# Patient Record
Sex: Female | Born: 1955 | Race: Black or African American | Hispanic: No | Marital: Single | Smoking: Never smoker
Health system: Southern US, Community
[De-identification: ages and names within clinical notes are randomized; demographics above are authoritative.]

## PROBLEM LIST (undated history)

## (undated) DIAGNOSIS — E785 Hyperlipidemia, unspecified: Secondary | ICD-10-CM

## (undated) DIAGNOSIS — F329 Major depressive disorder, single episode, unspecified: Secondary | ICD-10-CM

## (undated) DIAGNOSIS — R42 Dizziness and giddiness: Secondary | ICD-10-CM

## (undated) DIAGNOSIS — R609 Edema, unspecified: Secondary | ICD-10-CM

## (undated) DIAGNOSIS — I10 Essential (primary) hypertension: Secondary | ICD-10-CM

## (undated) DIAGNOSIS — J45909 Unspecified asthma, uncomplicated: Secondary | ICD-10-CM

## (undated) DIAGNOSIS — F32A Depression, unspecified: Secondary | ICD-10-CM

## (undated) DIAGNOSIS — M1990 Unspecified osteoarthritis, unspecified site: Secondary | ICD-10-CM

## (undated) HISTORY — DX: Major depressive disorder, single episode, unspecified: F32.9

## (undated) HISTORY — DX: Edema, unspecified: R60.9

## (undated) HISTORY — PX: HERNIA REPAIR: SHX51

## (undated) HISTORY — DX: Unspecified asthma, uncomplicated: J45.909

## (undated) HISTORY — DX: Depression, unspecified: F32.A

## (undated) HISTORY — DX: Unspecified osteoarthritis, unspecified site: M19.90

## (undated) HISTORY — DX: Hyperlipidemia, unspecified: E78.5

---

## 2011-11-08 ENCOUNTER — Emergency Department (HOSPITAL_COMMUNITY)
Admission: EM | Admit: 2011-11-08 | Discharge: 2011-11-08 | Disposition: A | Discharge status: Home / Self Care | Payer: Self-pay | Attending: Emergency Medicine | Admitting: Emergency Medicine

## 2011-11-08 ENCOUNTER — Other Ambulatory Visit: Payer: Self-pay

## 2011-11-08 ENCOUNTER — Emergency Department (HOSPITAL_COMMUNITY): Payer: Self-pay

## 2011-11-08 ENCOUNTER — Encounter (HOSPITAL_COMMUNITY): Payer: Self-pay

## 2011-11-08 DIAGNOSIS — R110 Nausea: Secondary | ICD-10-CM | POA: Insufficient documentation

## 2011-11-08 DIAGNOSIS — Z7982 Long term (current) use of aspirin: Secondary | ICD-10-CM | POA: Insufficient documentation

## 2011-11-08 DIAGNOSIS — I10 Essential (primary) hypertension: Secondary | ICD-10-CM | POA: Insufficient documentation

## 2011-11-08 DIAGNOSIS — R079 Chest pain, unspecified: Secondary | ICD-10-CM | POA: Insufficient documentation

## 2011-11-08 DIAGNOSIS — R51 Headache: Secondary | ICD-10-CM | POA: Insufficient documentation

## 2011-11-08 DIAGNOSIS — R0602 Shortness of breath: Secondary | ICD-10-CM | POA: Insufficient documentation

## 2011-11-08 HISTORY — DX: Essential (primary) hypertension: I10

## 2011-11-08 HISTORY — DX: Dizziness and giddiness: R42

## 2011-11-08 LAB — CBC
MCH: 28.5 pg (ref 26.0–34.0)
MCHC: 33 g/dL (ref 30.0–36.0)
MCV: 86.3 fL (ref 78.0–100.0)
Platelets: 228 10*3/uL (ref 150–400)
RBC: 4.53 MIL/uL (ref 3.87–5.11)
RDW: 15.3 % (ref 11.5–15.5)

## 2011-11-08 LAB — BASIC METABOLIC PANEL
CO2: 25 mEq/L (ref 19–32)
Calcium: 10.1 mg/dL (ref 8.4–10.5)
Creatinine, Ser: 0.6 mg/dL (ref 0.50–1.10)
GFR calc non Af Amer: >90 mL/min (ref >90)

## 2011-11-08 LAB — POCT I-STAT TROPONIN I: Troponin i, poc: 0 ng/mL (ref 0.00–0.08)

## 2011-11-08 MED ORDER — FAMOTIDINE 20 MG PO TABS: 20.0000 mg | ORAL_TABLET | Freq: Two times a day (BID) | ORAL | Status: DC

## 2011-11-08 NOTE — ED Provider Notes (Signed)
History     CSN: [id number]  Arrival date & time 11/08/11  1145   First MD Initiated Contact with Patient 11/08/11 1157      Chief Complaint  Patient presents with  . Chest Pain    (Consider location/radiation/quality/duration/timing/severity/associated sxs/prior treatment) HPI Comments: Patient reports sudden onset of" heaviness" to her left chest that began at approximately 4 AM this morning. She states the pain radiates into her left arm. She also reports some nausea and shortness of breath with exertion. She also states that her chest pain seems to be exacerbated by exertion or palpation. She states that she had similar symptoms last August and was seen at another facility and admitted for 3 days but she is unclear of her diagnosis.  She was seen earlier this morning and [facility] [place] [facility] and was advised to come to the ER for further evaluation.  She denies dizziness, numbness, abdominal pain, vomiting, or visual changes.  Patient is a 56 y.o. female presenting with chest pain. The history is provided by the patient. No language interpreter was used.  Chest Pain The chest pain began 6 - 12 hours ago. Chest pain occurs constantly. The chest pain is unchanged. The pain is associated with breathing and exertion. The severity of the pain is moderate. Quality: "heaviness" The pain radiates to the left arm. Chest pain is worsened by exertion. Primary symptoms include shortness of breath and nausea. Pertinent negatives for primary symptoms include no fever, no fatigue, no syncope, no cough, no wheezing, no palpitations, no abdominal pain, no vomiting, no dizziness and no altered mental status.  The shortness of breath began today. The shortness of breath developed with exertion. The shortness of breath is mild.  Pertinent negatives for associated symptoms include no claudication, no diaphoresis, no lower extremity edema, no near-syncope, no numbness, no paroxysmal nocturnal dyspnea and  no weakness. She tried aspirin for the symptoms. Risk factors include alcohol intake.  Her past medical history is significant for hyperlipidemia and hypertension.  Pertinent negatives for past medical history include no CHF, no diabetes, no MI and no TIA. Past medical history comments: patient reports hx of chest pain last year but does not recall the diagnosis Procedure history comments: patient unsure of any previous cardiac catheterization.     Past Medical History  Diagnosis Date  . Hypertension   . Arthritis   . Vertigo   . Hypercholesteremia   . Depression (emotion)   . Migraine     Past Surgical History  Procedure Date  . Hernia repair     No family history on file.  History  Substance Use Topics  . Smoking status: Never Smoker   . Smokeless tobacco: Not on file  . Alcohol Use: Yes    OB History    Grav Para Term Preterm Abortions TAB SAB Ect Mult Living                  Review of Systems  Constitutional: Negative for fever, chills, diaphoresis and fatigue.  HENT: Negative for sore throat, trouble swallowing, neck pain and neck stiffness.   Respiratory: Positive for chest tightness and shortness of breath. Negative for cough, wheezing and stridor.   Cardiovascular: Positive for chest pain. Negative for palpitations, claudication, leg swelling, syncope and near-syncope.  Gastrointestinal: Positive for nausea. Negative for vomiting, abdominal pain and blood in stool.  Genitourinary: Negative for dysuria, hematuria and flank pain.  Musculoskeletal: Negative for myalgias, back pain and arthralgias.  Skin: Negative.  Negative for rash.  Neurological: Positive for headaches. Negative for dizziness, syncope, speech difficulty, weakness and numbness.  Hematological: Negative for adenopathy. Does not bruise/bleed easily.  Psychiatric/Behavioral: Negative for decreased concentration and altered mental status.  All other systems reviewed and are negative.    Allergies   Sulfa antibiotics  Home Medications  No current outpatient prescriptions on file.  BP 168/86  Pulse 79  Temp(Src) 97.5 F (36.4 C) (Oral)  Resp 14  Ht 5\' 4"  (1.626 m)  Wt 158 lb (71.668 kg)  BMI 27.12 kg/m2  SpO2 100%  Physical Exam  Nursing note and vitals reviewed. Constitutional: She is oriented to person, place, and time. She appears well-developed and well-nourished. No distress.  HENT:  Head: Normocephalic and atraumatic.  Mouth/Throat: Oropharynx is clear and moist.  Eyes: EOM are normal. Pupils are equal, round, and reactive to light.  Neck: Normal range of motion. Neck supple. No thyromegaly present.  Cardiovascular: Normal rate, regular rhythm, normal heart sounds and intact distal pulses.   No murmur heard. Pulmonary/Chest: Effort normal and breath sounds normal. No respiratory distress. She exhibits no tenderness.  Abdominal: Soft. She exhibits no distension. There is no tenderness.  Musculoskeletal: Normal range of motion. She exhibits no edema and no tenderness.  Lymphadenopathy:    She has no cervical adenopathy.  Neurological: She is alert and oriented to person, place, and time. She has normal reflexes. No cranial nerve deficit. She exhibits normal muscle tone. Coordination normal.  Skin: Skin is warm and dry.    ED Course  Procedures (including critical care time)       MDM     Date: 11/08/2011  Rate: 79  Rhythm: normal sinus rhythm  QRS Axis: normal  Intervals: normal  ST/T Wave abnormalities: nonspecific ST/T changes  Conduction Disutrbances:none  Narrative Interpretation:   Old EKG Reviewed: none available    EKG read by Dr. Denton Lank.  No previous EKG's in the system  Patient was seen at [facility] Department prior to the ER arrival today and advised to go to the ER. She was given one 325 mg aspirin prior to arrival.  Patient states she was seen at [facility] last year for similar symptoms. We will try to  obtain copies of her admission.  Patient seen by the EDP and care plan was discussed.  Admission notes from [facility] were reviewed by EDP.    Oddis Westling L. [place], [place] 11/08/11 [phone]

## 2011-11-08 NOTE — ED Notes (Addendum)
Pt states "chest has been feeling like this for about 2 months off and on". Pt reports heaviness in chest and numbness with tingling in bilateral lower arms. Pt is resting quietly with cardiac monitoring, O2 monitoring and Nasal cannula at 2 LPM. No distress or discomfort noted at this time. EKG completed with no abnormal results noted.  EDP provided a copy with no new orders noted.

## 2011-11-08 NOTE — ED Notes (Signed)
Pt states left side of chest started hurting at 0400 this am along with headache. Describes chest pain as "heaviness" with sob and nausea at times and radiation to left arm. resp even/nonlabored at this time. Skin warm dry. A/o x3.

## 2011-11-08 NOTE — ED Notes (Signed)
Pt removed from [facility] oxygen. Pt appears to be in no distress.

## 2011-11-09 NOTE — ED Provider Notes (Signed)
Medical screening examination/treatment/procedure(s) were conducted as a shared visit with non-physician practitioner(s) and myself.  I personally evaluated the patient during the encounter Pt c/o epigastric/xiphoid area pain onset at rest approx 11-12 hours ago, no radiation. No hx cad. No fam hx cad. No hx gallstones. ?hx gerd. abd soft nt. Chest cta. Rrr. After 12 hours symptoms, trop 0.   Suzi Roots, MD 11/09/11 [phone]

## 2011-11-20 ENCOUNTER — Encounter: Payer: Self-pay | Admitting: Physician Assistant

## 2011-11-20 ENCOUNTER — Ambulatory Visit (INDEPENDENT_AMBULATORY_CARE_PROVIDER_SITE_OTHER): Payer: Self-pay | Admitting: Physician Assistant

## 2011-11-20 DIAGNOSIS — R609 Edema, unspecified: Secondary | ICD-10-CM

## 2011-11-20 DIAGNOSIS — E785 Hyperlipidemia, unspecified: Secondary | ICD-10-CM

## 2011-11-20 DIAGNOSIS — F411 Generalized anxiety disorder: Secondary | ICD-10-CM

## 2011-11-20 DIAGNOSIS — F419 Anxiety disorder, unspecified: Secondary | ICD-10-CM | POA: Insufficient documentation

## 2011-11-20 DIAGNOSIS — R079 Chest pain, unspecified: Secondary | ICD-10-CM

## 2011-11-20 DIAGNOSIS — E782 Mixed hyperlipidemia: Secondary | ICD-10-CM

## 2011-11-20 DIAGNOSIS — I10 Essential (primary) hypertension: Secondary | ICD-10-CM

## 2011-11-20 MED ORDER — NITROGLYCERIN 0.4 MG SL SUBL: 0.4000 mg | SUBLINGUAL_TABLET | SUBLINGUAL | Status: DC | PRN

## 2011-11-20 NOTE — Patient Instructions (Signed)
**Note De-Identified [name] Obfuscation** Your physician has recommended you make the following change in your medication: Start taking Nitroglycerin as needed for chest pain (please follow directions on bottle)  Your physician has requested that you have an echocardiogram. Echocardiography is a painless test that uses sound waves to create images of your heart. It provides your doctor with information about the size and shape of your heart and how well your heart's chambers and valves are working. This procedure takes approximately one hour. There are no restrictions for this procedure.  Your physician has requested that you have en exercise stress myoview. For further information please visit [email or web address] Please follow instruction sheet, as given.  Your physician recommends that you schedule a follow-up appointment in: 2 weeks

## 2011-11-20 NOTE — Assessment & Plan Note (Signed)
Patient has history of edema with a hospitalization at [facility] in August. She has had no recurrence. We will check a 2-D echo for LV function

## 2011-11-20 NOTE — Assessment & Plan Note (Signed)
Patient states she has a history of anxiety which may be contributing to some of her symptoms.

## 2011-11-20 NOTE — Assessment & Plan Note (Signed)
treated

## 2011-11-20 NOTE — Progress Notes (Signed)
HPI:  This is a 56 year old African American female patient who is new to our practice. She went to the emergency room on 11/08/11 with chest pain. Cardiac enzymes were negative and EKG was normal. She was sent to [place] for further evaluation.  The patient complains of a chest heaviness that radiates to her left arm and is associated with dyspnea and diaphoresis. She also has some throbbing sensations. This will last 4 or 5 minutes and eases spontaneously. It can occur with washing dishes,or carrying a gallon of water. It occurs 3-4 times a week and sometimes one to 3 times a day when it does occur. She has limited her activity because of this. She also has awakened from sleep with this chest pain. She does have history of anxiety which may be contributing to it.  Patient had an admission to [facility] in August with swelling of her legs and hands and face. She said she had a CT scan but can't remember what else they did. She was not having chest pain at that time.  Patient does have a history of hypertension. She said her cholesterol was elevated when she was a [place] at her primary care did not feel like she needed this treated. She is not a diabetic and has never smoked. Her father died of heart failure and his 67s and grandparents had enlarged heart. Her mother is in her 26s with hypertension and she has a sister who had some type of heart problems but she is unsure what it is.  Allergies  Allergen Reactions  . Latex Itching  . Sulfa Antibiotics Itching and Other (See Comments)    Blood shot eyes    Current Outpatient Prescriptions on File Prior to Visit  Medication Sig Dispense Refill  . aspirin EC 81 MG tablet Take 162 mg by mouth daily.      . budesonide (RHINOCORT AQUA) 32 MCG/ACT nasal spray Place 2 sprays into the nose at bedtime.      . citalopram (CELEXA) 40 MG tablet Take 40 mg by mouth daily.      . famotidine (PEPCID) 20 MG tablet Take 1 tablet (20 mg total) by mouth 2 (two)  times daily.  30 tablet  0  . hydrOXYzine (ATARAX/VISTARIL) 25 MG tablet Take 25 mg by mouth 3 (three) times daily as needed. For itching      . meclizine (ANTIVERT) 25 MG tablet Take 25 mg by mouth 3 (three) times daily as needed. For dizziness      . meloxicam (MOBIC) 15 MG tablet Take 15 mg by mouth daily.      . traZODone (DESYREL) 100 MG tablet Take 50-100 mg by mouth at bedtime.      . verapamil (CALAN-SR) 120 MG CR tablet Take 240 mg by mouth 2 (two) times daily.        Past Medical History  Diagnosis Date  . Hypertension   . Arthritis   . Vertigo   . Hypercholesteremia   . Depression (emotion)   . Migraine     Past Surgical History  Procedure Date  . Hernia repair     Family History  Problem Relation Age of Onset  . Hypertension Mother   . Hyperlipidemia Mother   . Heart failure Father   . Heart disease Sister     History   Social History  . Marital Status: Single    Spouse Name: N/A    Number of Children: N/A  . Years of Education: N/A  Occupational History  . Not on file.   Social History Main Topics  . Smoking status: Never Smoker   . Smokeless tobacco: Not on file  . Alcohol Use: Yes  . Drug Use: No  . Sexually Active:    Other Topics Concern  . Not on file   Social History Narrative  . No narrative on file    ROS: See HPI Eyes: Negative Ears:Negative for hearing loss, tinnitus Cardiovascular: Negative for irregular heartbeat,  near-syncope, orthopnea, paroxysmal nocturnal dyspnea and syncope, claudication, cyanosis,.  Respiratory:   Negative for cough, hemoptysis,  sleep disturbances due to breathing, sputum production and wheezing.   Endocrine: Negative for cold intolerance and heat intolerance.  Hematologic/Lymphatic: Negative for adenopathy and bleeding problem. Does not bruise/bleed easily.  Musculoskeletal: Negative.   Gastrointestinal: Negative for nausea, vomiting, reflux, abdominal pain, diarrhea, constipation.   Genitourinary:  Negative for bladder incontinence, dysuria, flank pain, frequency, hematuria, hesitancy, nocturia and urgency.  Neurological: Negative.  Allergic/Immunologic: Negative for environmental allergies.   PHYSICAL EXAM: Well-nournished, in no acute distress. Neck: No JVD, HJR, Bruit, or thyroid enlargement Lungs: No tachypnea, clear without wheezing, rales, or rhonchi Cardiovascular: RRR, PMI not displaced, heart sounds normal, no murmurs, gallops, bruit, thrill, or heave. Abdomen: BS normal. Soft without organomegaly, masses, lesions or tenderness. Extremities: without cyanosis, clubbing or edema. Good distal pulses bilateral SKin: Warm, no lesions or rashes  Musculoskeletal: No deformities Neuro: no focal signs  BP 129/88  Pulse 99  Resp 18  Ht 5\' 4"  (1.626 m)  Wt 156 lb (70.761 kg)  BMI 26.78 kg/m2   ZOX:WRUEA rhythm no acute change

## 2011-11-20 NOTE — Assessment & Plan Note (Signed)
Patient states she has high cholesterol but was told that it didn't need to be treated. We will check a fasting lipid panel.

## 2011-11-20 NOTE — Assessment & Plan Note (Signed)
Patient has had chest pain since January that is worrisome for ischemia. I had a long discussion with the patient concerning evaluation with cardiac catheterization versus stress Myoview. The patient is adamant she does not want to proceed with cardiac catheterization as a first step. We will assess her with a stress Myoview and 2-D echo. I will also give her nitroglycerin sublingual to take as needed.

## 2011-12-03 ENCOUNTER — Other Ambulatory Visit (HOSPITAL_COMMUNITY): Payer: Self-pay

## 2011-12-03 ENCOUNTER — Encounter (HOSPITAL_COMMUNITY): Payer: Self-pay

## 2011-12-03 ENCOUNTER — Ambulatory Visit (HOSPITAL_COMMUNITY): Payer: Self-pay

## 2011-12-03 ENCOUNTER — Inpatient Hospital Stay (HOSPITAL_COMMUNITY): Admission: RE | Admit: 2011-12-03 | Payer: Self-pay | Source: Ambulatory Visit

## 2011-12-04 ENCOUNTER — Encounter (HOSPITAL_COMMUNITY)
Admission: RE | Admit: 2011-12-04 | Discharge: 2011-12-04 | Disposition: A | Discharge status: Home / Self Care | Payer: Self-pay | Source: Ambulatory Visit | Attending: Physician Assistant | Admitting: Physician Assistant

## 2011-12-04 ENCOUNTER — Ambulatory Visit (INDEPENDENT_AMBULATORY_CARE_PROVIDER_SITE_OTHER): Payer: Self-pay

## 2011-12-04 ENCOUNTER — Ambulatory Visit (HOSPITAL_COMMUNITY)
Admission: RE | Admit: 2011-12-04 | Discharge: 2011-12-04 | Disposition: A | Discharge status: Home / Self Care | Payer: Self-pay | Source: Ambulatory Visit | Attending: Physician Assistant | Admitting: Physician Assistant

## 2011-12-04 ENCOUNTER — Encounter (HOSPITAL_COMMUNITY): Payer: Self-pay

## 2011-12-04 DIAGNOSIS — R079 Chest pain, unspecified: Secondary | ICD-10-CM | POA: Insufficient documentation

## 2011-12-04 DIAGNOSIS — I10 Essential (primary) hypertension: Secondary | ICD-10-CM | POA: Insufficient documentation

## 2011-12-04 DIAGNOSIS — I517 Cardiomegaly: Secondary | ICD-10-CM

## 2011-12-04 DIAGNOSIS — E785 Hyperlipidemia, unspecified: Secondary | ICD-10-CM | POA: Insufficient documentation

## 2011-12-04 MED ORDER — TECHNETIUM TC 99M TETROFOSMIN IV KIT
30.0000 | PACK | Freq: Once | INTRAVENOUS | Status: AC | PRN
  Administered 2011-12-04: 30 via INTRAVENOUS

## 2011-12-04 MED ORDER — TECHNETIUM TC 99M TETROFOSMIN IV KIT
10.0000 | PACK | Freq: Once | INTRAVENOUS | Status: AC | PRN
  Administered 2011-12-04: 10 via INTRAVENOUS

## 2011-12-04 NOTE — Progress Notes (Signed)
*  PRELIMINARY RESULTS* Echocardiogram 2D Echocardiogram has been performed.  Bridget Yang 12/04/2011, 12:57 PM

## 2011-12-04 NOTE — Progress Notes (Signed)
Stress Lab Nurses Notes - Bridget Yang  Bridget Yang 12/04/2011  Reason for doing test: Chest Pain Type of test: Stress Myoview Nurse performing test: Parke Poisson, RN Nuclear Medicine Tech: Lyndel Pleasure Echo Tech: Not Applicable MD performing test: R. Dietrich Pates Family MD: Health Dept. Test explained and consent signed: yes IV started: 22g jelco, Saline lock flushed, IV in progress from floor and Saline lock started in radiology Symptoms: chest discomfort, SOB & fatigue in legs Treatment/Intervention: None Reason test stopped: fatigue and SOB After recovery IV was: Discontinued via X-ray tech and No redness or edema Patient to return to Nuc. Med at : 12:15 Patient discharged: Home Patient's Condition upon discharge was: stable Comments: During test peak BP 188/78 & HR 150.  Recovery BP 142/88 & HR 78.  Symptoms resolved in recovery. [name] T

## 2011-12-05 ENCOUNTER — Ambulatory Visit: Payer: Self-pay | Admitting: Cardiology

## 2011-12-05 ENCOUNTER — Other Ambulatory Visit: Payer: Self-pay | Admitting: *Deleted

## 2011-12-05 ENCOUNTER — Encounter: Payer: Self-pay | Admitting: Cardiology

## 2011-12-05 DIAGNOSIS — E785 Hyperlipidemia, unspecified: Secondary | ICD-10-CM | POA: Insufficient documentation

## 2011-12-05 DIAGNOSIS — R609 Edema, unspecified: Secondary | ICD-10-CM | POA: Insufficient documentation

## 2011-12-05 DIAGNOSIS — R079 Chest pain, unspecified: Secondary | ICD-10-CM | POA: Insufficient documentation

## 2011-12-05 MED ORDER — NITROGLYCERIN 0.4 MG SL SUBL: 0.4000 mg | SUBLINGUAL_TABLET | SUBLINGUAL | Status: DC | PRN

## 2015-06-26 ENCOUNTER — Other Ambulatory Visit: Payer: Self-pay | Admitting: Neurology

## 2015-06-26 DIAGNOSIS — R519 Headache, unspecified: Secondary | ICD-10-CM

## 2015-06-26 DIAGNOSIS — R51 Headache: Principal | ICD-10-CM

## 2015-07-06 ENCOUNTER — Ambulatory Visit (HOSPITAL_COMMUNITY): Payer: Medicare Other

## 2015-07-19 ENCOUNTER — Ambulatory Visit (HOSPITAL_COMMUNITY)
Admission: RE | Admit: 2015-07-19 | Discharge: 2015-07-19 | Disposition: A | Discharge status: Home / Self Care | Payer: Medicare Other | Source: Ambulatory Visit | Attending: Neurology | Admitting: Neurology

## 2015-07-19 DIAGNOSIS — R51 Headache: Secondary | ICD-10-CM | POA: Diagnosis present

## 2015-07-19 DIAGNOSIS — R200 Anesthesia of skin: Secondary | ICD-10-CM | POA: Insufficient documentation

## 2015-07-19 DIAGNOSIS — R519 Headache, unspecified: Secondary | ICD-10-CM

## 2015-07-19 DIAGNOSIS — I739 Peripheral vascular disease, unspecified: Secondary | ICD-10-CM | POA: Insufficient documentation

## 2015-07-19 DIAGNOSIS — J320 Chronic maxillary sinusitis: Secondary | ICD-10-CM | POA: Diagnosis not present

## 2016-09-20 ENCOUNTER — Other Ambulatory Visit (HOSPITAL_COMMUNITY): Payer: Self-pay | Admitting: Family

## 2016-09-20 DIAGNOSIS — Z1231 Encounter for screening mammogram for malignant neoplasm of breast: Secondary | ICD-10-CM

## 2016-10-09 ENCOUNTER — Ambulatory Visit (HOSPITAL_COMMUNITY): Payer: Medicare Other

## 2017-01-02 DIAGNOSIS — M1990 Unspecified osteoarthritis, unspecified site: Secondary | ICD-10-CM | POA: Insufficient documentation

## 2017-09-25 IMAGING — MR MR HEAD W/O CM
10 of 13 series · 33 of 48 positions shown · non-contrast
Comparison: None currently available (07/20/1999)

CLINICAL DATA: Generalized headaches for 7 months. Weakness. No
known injury

EXAM:
MRI HEAD WITHOUT CONTRAST
TECHNIQUE: Multiplanar, multiecho pulse sequences of the brain and surrounding
structures were obtained without intravenous contrast.

[Series 2: [id number] · sagittal · 5.0mm · 0.41mm/px · 1 of 20 slices shown]
[im 1/20]
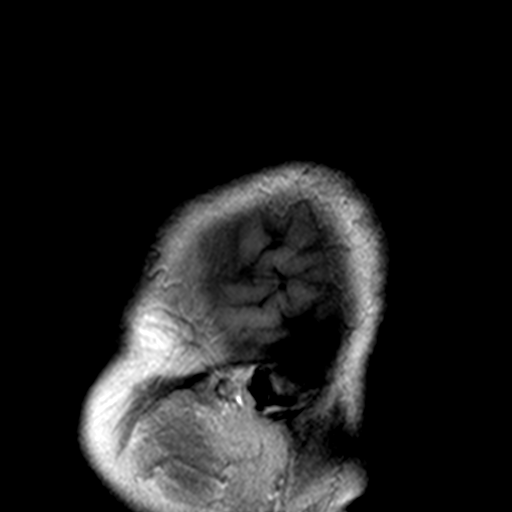

[Series 5: T2 · axial · 5.0mm · 0.51mm/px · z∈[-126,+15]mm · 2 of 23 slices shown (1 of 2)]
[im 1/23]
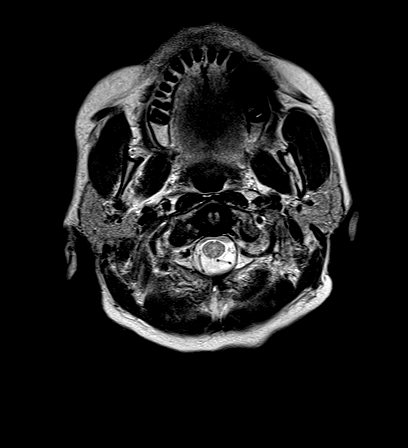
[im 23/23]
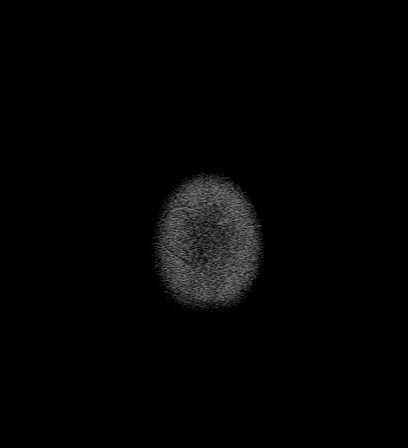

[Series 6: FLAIR · axial · 5.0mm · 0.38mm/px · z∈[-128,+13]mm · 2 of 23 slices shown]
[im 1/23]
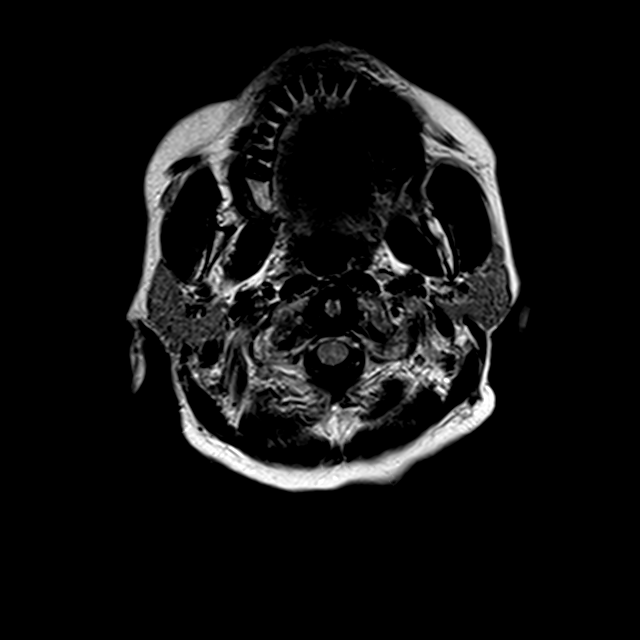
[im 23/23]
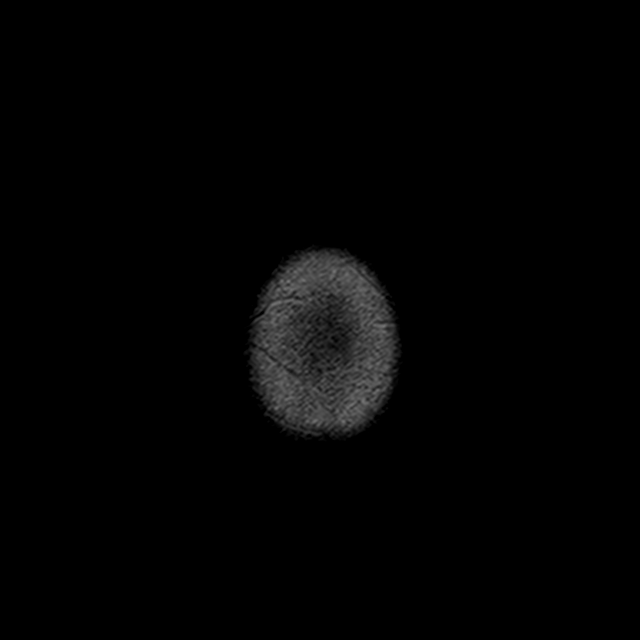

[Series 7: T1 · axial · 2.0mm · 0.45mm/px · z∈[-139,+46]mm · 8 of 95 slices shown]
[im 1/95]
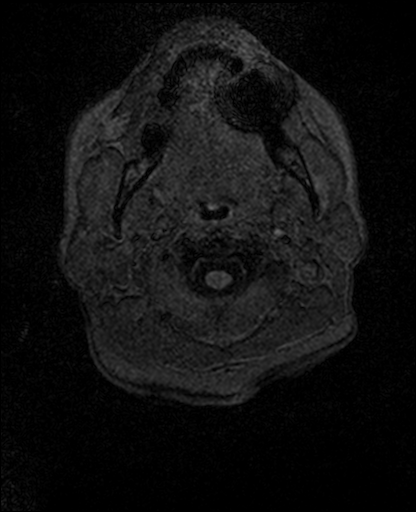
[im 14/95]
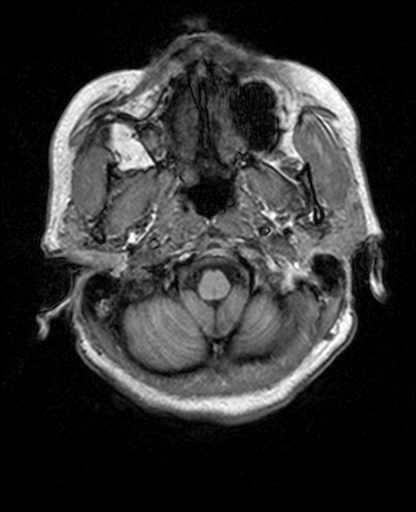
[im 27/95]
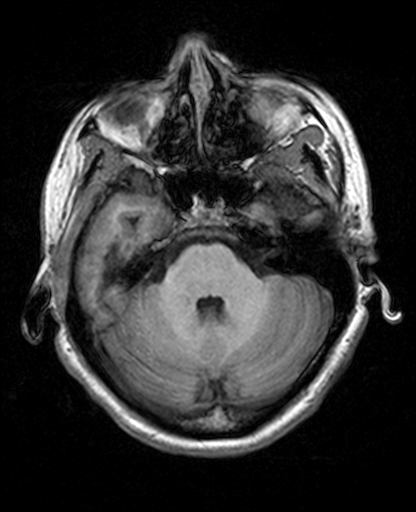
[im 41/95]
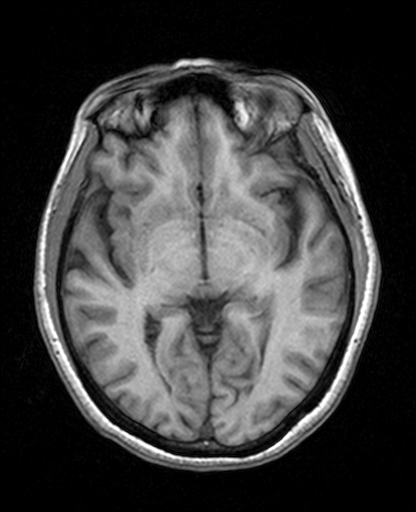
[im 54/95]
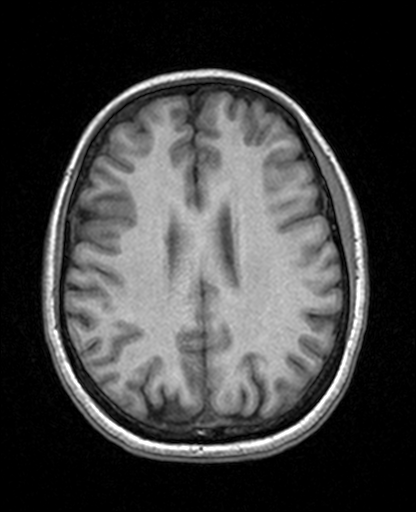
[im 68/95]
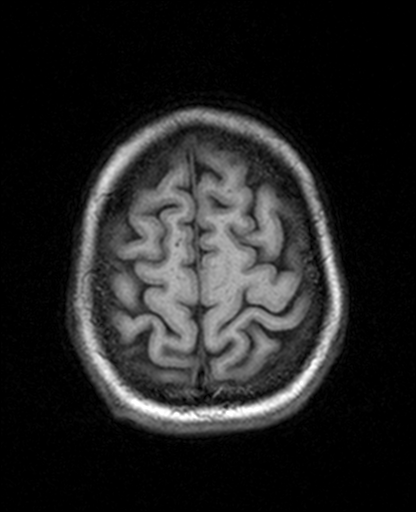
[im 81/95]
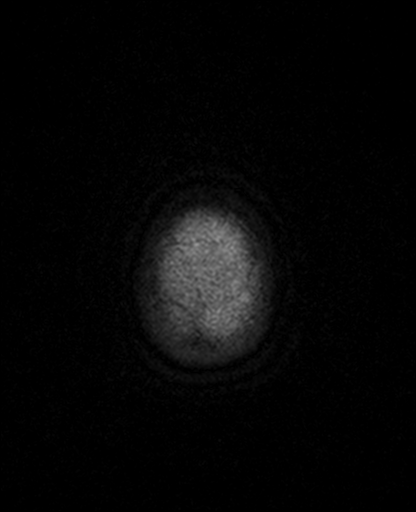
[im 95/95]
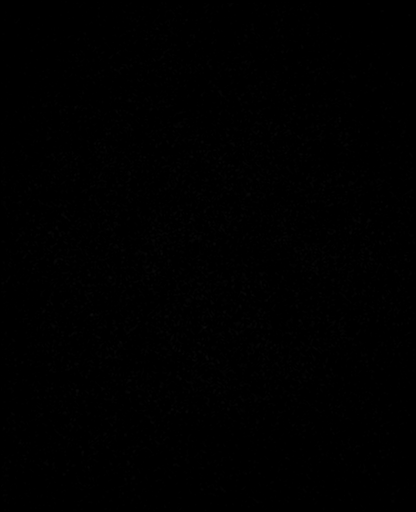

[Series 8: trauma axial · axial · 5.0mm · 0.45mm/px · z∈[-120,+8]mm · 2 of 21 slices shown]
[im 1/21]
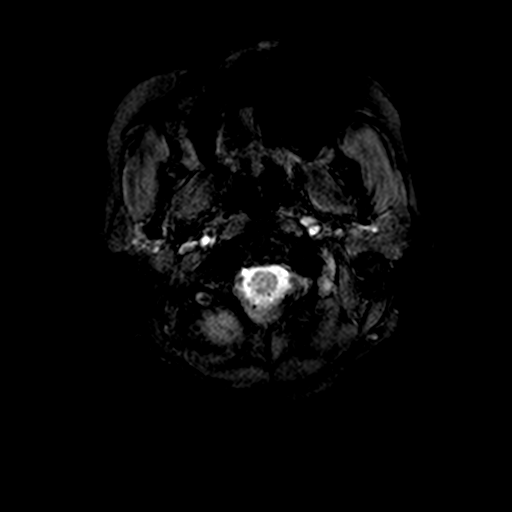
[im 21/21]
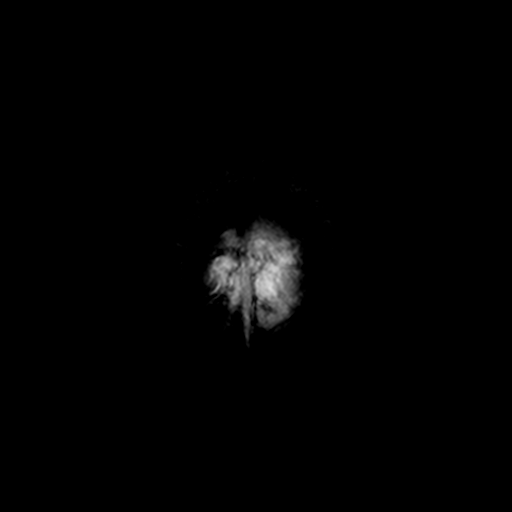

[Series 9: T2 · coronal · 5.0mm · 0.64mm/px · 2 of 24 slices shown (2 of 2)]
[im 1/24]
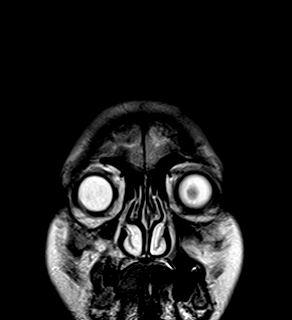
[im 24/24]
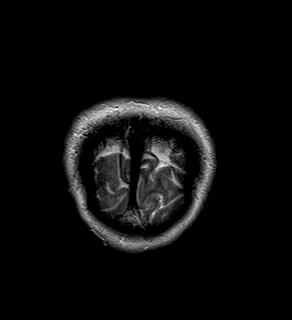

[Series 100: <mpr thick range> · axial · 3.0mm · 0.82mm/px · z∈[-122,+3]mm · 4 of 43 slices shown]
[im 1/43]
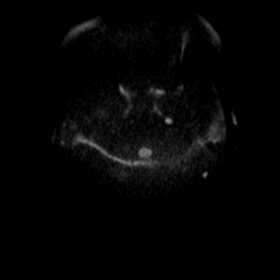
[im 15/43]
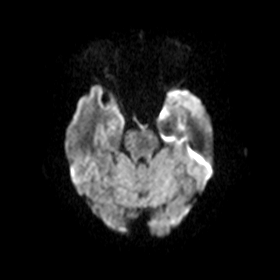
[im 29/43]
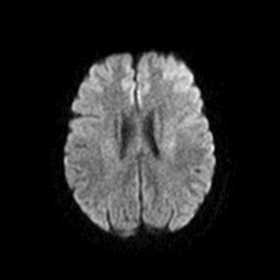
[im 43/43]
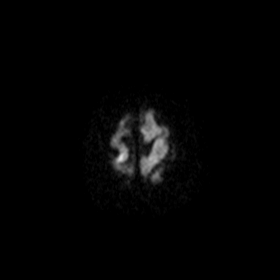

[Series 101: <mpr thick range(1)> · coronal · 3.0mm · 0.82mm/px · 4 of 52 slices shown]
[im 1/52]
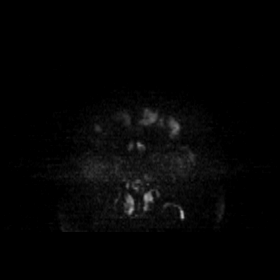
[im 18/52]
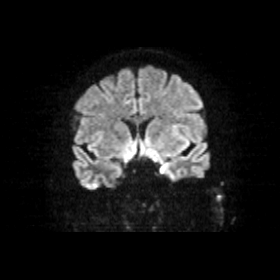
[im 35/52]
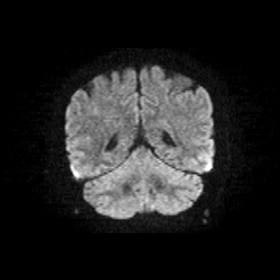
[im 52/52]
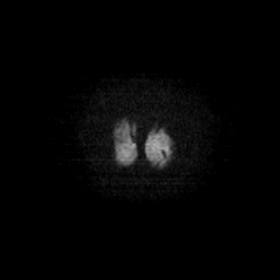

[Series 102: <mpr thick range(2)> · axial · 3.0mm · 0.82mm/px · z∈[-122,+3]mm · 4 of 42 slices shown]
[im 1/42]
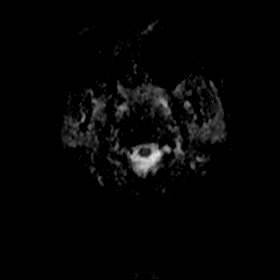
[im 14/42]
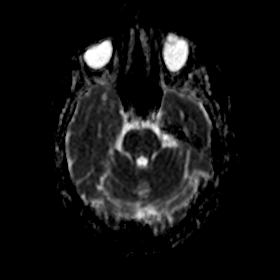
[im 28/42]
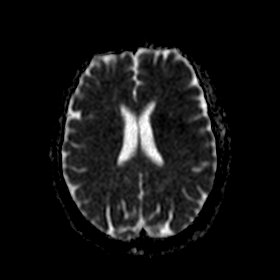
[im 42/42]
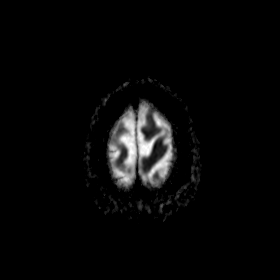

[Series 103: <mpr thick range(3)> · coronal · 3.0mm · 0.82mm/px · 4 of 51 slices shown]
[im 1/51]
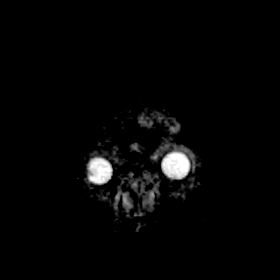
[im 17/51]
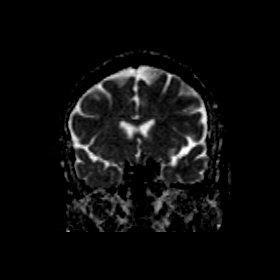
[im 34/51]
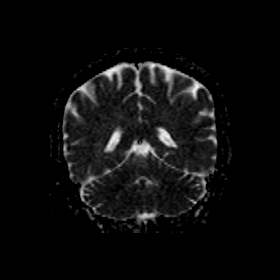
[im 51/51]
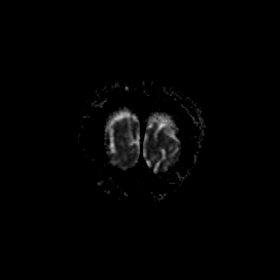

[33 of 48 positions shown; findings below may reference images not displayed]

FINDINGS: Calvarium and upper cervical spine: No focal marrow signal
abnormality.

Orbits: No significant findings.

Sinuses and Mastoids: Mucous retention cysts in the inferior right
maxillary antrum, which is atelectatic. No acute sinusitis or
mastoiditis.

Brain: No acute abnormality such as acute infarct, hemorrhage,
hydrocephalus, or mass lesion. No evidence of large vessel
occlusion.

5 or 6 small foci of FLAIR hyperintensity in the cerebral white
matter. These are likely from chronic small vessel disease given
history of hypertension and hyperlipidemia, but could be related to
complicated migraine given history. Given age, gender, and single
focus in the juxta cortical white matter near the right sylvian
fissure a demyelinating process is included, but considered less
likely.
IMPRESSION: 1. No acute intracranial finding or mass.
2. Chronic right maxillary sinusitis with atelectasis.
3. Few white matter hyperintensities, usually from chronic small
vessel disease. Differential considerations discussed above.

## 2017-10-13 ENCOUNTER — Other Ambulatory Visit (HOSPITAL_COMMUNITY): Payer: Self-pay | Admitting: Respiratory Therapy

## 2017-10-13 DIAGNOSIS — R059 Cough, unspecified: Secondary | ICD-10-CM

## 2017-10-13 DIAGNOSIS — R05 Cough: Secondary | ICD-10-CM

## 2017-10-28 ENCOUNTER — Ambulatory Visit (HOSPITAL_COMMUNITY): Admission: RE | Admit: 2017-10-28 | Payer: Medicare Other | Source: Ambulatory Visit

## 2017-11-11 ENCOUNTER — Inpatient Hospital Stay (HOSPITAL_COMMUNITY): Admission: RE | Admit: 2017-11-11 | Payer: Medicare Other | Source: Ambulatory Visit

## 2017-11-13 ENCOUNTER — Ambulatory Visit (HOSPITAL_COMMUNITY)
Admission: RE | Admit: 2017-11-13 | Discharge: 2017-11-13 | Disposition: A | Discharge status: Home / Self Care | Payer: Medicare Other | Source: Ambulatory Visit | Attending: Pulmonary Disease | Admitting: Pulmonary Disease

## 2017-11-13 DIAGNOSIS — R05 Cough: Secondary | ICD-10-CM | POA: Diagnosis present

## 2017-11-13 DIAGNOSIS — J449 Chronic obstructive pulmonary disease, unspecified: Secondary | ICD-10-CM | POA: Insufficient documentation

## 2017-11-13 DIAGNOSIS — R059 Cough, unspecified: Secondary | ICD-10-CM

## 2017-11-13 LAB — PULMONARY FUNCTION TEST
DL/VA % pred: 81 %
DL/VA: 4.03 ml/min/mmHg/L
DLCO unc % pred: 69 %
DLCO unc: 17.69 ml/min/mmHg
FEF 25-75 Post: 1.81 L/sec
FEF 25-75 Pre: 1.36 L/sec
FEF2575-%CHANGE-POST: 33 %
FEF2575-%PRED-POST: 84 %
FEF2575-%PRED-PRE: 63 %
FEV1-%Change-Post: 4 %
FEV1-%PRED-POST: 91 %
FEV1-%Pred-Pre: 87 %
FEV1-Post: 1.99 L
FEV1-Pre: 1.9 L
FEV1FVC-%Change-Post: 2 %
FEV1FVC-%PRED-PRE: 97 %
FEV6-%CHANGE-POST: 1 %
FEV6-%PRED-POST: 93 %
FEV6-%Pred-Pre: 92 %
FEV6-Post: 2.51 L
FEV6-Pre: 2.46 L
FEV6FVC-%PRED-PRE: 103 %
FEV6FVC-%Pred-Post: 103 %
FVC-%CHANGE-POST: 1 %
FVC-%Pred-Post: 90 %
FVC-%Pred-Pre: 89 %
FVC-Post: 2.51 L
FVC-Pre: 2.46 L
POST FEV1/FVC RATIO: 79 %
Post FEV6/FVC ratio: 100 %
Pre FEV1/FVC ratio: 77 %
Pre FEV6/FVC Ratio: 100 %
RV % pred: 157 %
RV: 3.24 L
TLC % pred: 116 %
TLC: 6.07 L

## 2017-11-13 MED ORDER — ALBUTEROL SULFATE (2.5 MG/3ML) 0.083% IN NEBU
2.5000 mg | INHALATION_SOLUTION | Freq: Once | RESPIRATORY_TRACT | Status: AC
  Administered 2017-11-13: 2.5 mg via RESPIRATORY_TRACT

## 2020-02-11 ENCOUNTER — Other Ambulatory Visit: Payer: Self-pay

## 2020-02-11 ENCOUNTER — Encounter: Payer: Self-pay | Admitting: Internal Medicine

## 2020-02-11 ENCOUNTER — Ambulatory Visit (INDEPENDENT_AMBULATORY_CARE_PROVIDER_SITE_OTHER): Payer: Medicare (Managed Care) | Admitting: Internal Medicine

## 2020-02-11 DIAGNOSIS — J45991 Cough variant asthma: Secondary | ICD-10-CM

## 2020-02-11 NOTE — Addendum Note (Signed)
Addended by: Sandrea Hughs B on: 02/11/2020 08:11 PM   Modules accepted: Level of Service

## 2020-02-11 NOTE — Progress Notes (Addendum)
Adah Salvage, female    DOB: [birth year],    MRN: [id number]   Brief patient profile:  10 yobf never smoker grew up in [place] with bad seasonal rhinitis going back to childhood s cough/ wheeze at that point rx at [facility] with ? Allergy shots> symptoms better by HS and doing fine "just with home remedies " until 2016 moved to appt in [place] >>> recurrent sneezing in spring better zyrtec then cough worse winter > [facility] rx flovent 220 prn avg once or twice a week seems to help esp winter but finds if she avoids exposure to perfume, smoke, detergents she does better with her "allergies"  - note that the rhinitis component in hx is springtime mostly and the cough is more winter.  She has never been told she has asthma and got mad when I suggested this as a possible dx "I don't have asthma, I have allergies"  Note does not have any rescue saba and has never used one.     History of Present Illness  02/11/2020  Pulmonary/ 1st office eval/Darral Rishel  Chief Complaint  Patient presents with  . Consult    former Dr. Kathaleen Grinder pt.  seen for cough.  cough is resolved.  fumes and smoke trigger the cough.  Dyspnea:  Pt thinks she's getting older that's why she has doe when gets in a hurry, or with steps Cough: under control now with avoidance Sleep: able to lie flat/ 2 pillows  SABA use: does not have one and not interested " I don't think I have asthma, no one has ever told me that"  "you should check your records (note, explained to pt circumstances to no avail why this may or may not be helpful and certainly did not get [place] off the hook in terms of future management issues as "no one can see in the future" but we use hindsight ie  accurate updated hx cannot be replaced by searching thru past records   Pt became irritated and did not complete the interview or exam.      Past Medical History:  Diagnosis Date  . Chest pain    ED evaluation in 11/2011; refused cardiac catheterization  .  Degenerative joint disease   . Depression   . Edema    [facility]-05/2011  . Hyperlipidemia    Not requiring pharmacologic therapy  . Hypertension   . Migraine   . Vertigo     Outpatient Medications Prior to Visit - - NOTE:   Unable to verify as accurately reflecting what pt takes     Medication Sig Dispense Refill  . amLODipine (NORVASC) 5 MG tablet Take 5 mg by mouth daily.    Marland Kitchen aspirin EC 81 MG tablet Take 162 mg by mouth daily.    Marland Kitchen atorvastatin (LIPITOR) 20 MG tablet Take 20 mg by mouth daily.    . budesonide (RHINOCORT AQUA) 32 MCG/ACT nasal spray Place 2 sprays into the nose at bedtime.    . cetirizine (ZYRTEC) 10 MG tablet Take 10 mg by mouth daily.    . citalopram (CELEXA) 40 MG tablet Take 40 mg by mouth daily.    . diclofenac Sodium (VOLTAREN) 1 % GEL Apply topically.    Marland Kitchen FLOVENT HFA 220 MCG/ACT inhaler     . fluticasone (FLONASE) 50 MCG/ACT nasal spray Place into the nose.    . meclizine (ANTIVERT) 25 MG tablet Take 25 mg by mouth 3 (three) times daily as needed. For dizziness    .  meloxicam (MOBIC) 15 MG tablet Take 15 mg by mouth daily.    Marland Kitchen omeprazole (PRILOSEC) 40 MG capsule Take 40 mg by mouth daily.    Marland Kitchen topiramate (TOPAMAX) 25 MG tablet Take 25 mg by mouth daily.    . Vitamin D, Ergocalciferol, (DRISDOL) 50000 UNITS CAPS Take 50,000 Units by mouth every 7 (seven) days.    . famotidine (PEPCID) 20 MG tablet Take 1 tablet (20 mg total) by mouth 2 (two) times daily. 30 tablet 0  . hydrOXYzine (ATARAX/VISTARIL) 25 MG tablet Take 25 mg by mouth 3 (three) times daily as needed. For itching    . nitroGLYCERIN (NITROSTAT) 0.4 MG SL tablet Place 1 tablet (0.4 mg total) under the tongue every 5 (five) minutes as needed for chest pain. 25 tablet 3  . traZODone (DESYREL) 100 MG tablet Take 50-100 mg by mouth at bedtime.    . verapamil (CALAN-SR) 120 MG CR tablet Take 240 mg by mouth 2 (two) times daily.     No facility-administered medications prior to visit.      Objective:     BP 110/80 (BP Location: Left Arm, Cuff Size: Normal)   Pulse 72   Temp (!) 97 F (36.1 C) (Temporal)   Ht 5' 4.5" (1.638 m)   Wt 162 lb 6.4 oz (73.7 kg)   SpO2 100%   BMI 27.45 kg/m   SpO2: 100 %   Patient failed to answer   questions asked in a straightforward manner, tending to go off on tangents or answer questions with ambiguous medical terms or diagnoses and seemed aggravated  when asked the same question more than once for clarification, finally says "you're putting words in my mouth" whereas all I was doing was repeating what she told me to make sure I understood her symptoms correctly (I never actually did)   Declined exam      Assessment   Cough variant asthma vs irritant (nonspecific) upper airway cough Seasonal rhinitis as child thru HS > improved to point where just rx zyrtec prn -cough triggered by non-specific exp/  placed on flovent 220 by [facility] ? 2016/7 seemed to help  - PFTs 11/13/17  Nl x for mid flows in 60% percent range with mild curvature better p saba and c/w asthma but not dx     - The proper method of use, as well as anticipated side effects, of a metered-dose inhaler were discussed and demonstrated to the patient.    Advised that if flovent 220 truly does help, it would suggest asthma, whether based  allergy or other trigger  doesn' t matter but past response to flovent did not guarantee future response and she should consider having a rescue inhaler.   Effective use of flovent in this setting requires either daily use or clairvoyance as take it takes days to a week to start working and really is not appropriate for prn use.  symbicort 80 or 160 would be better choice Based on two studies from NEJM  378; 20 p 1865 (2018) and 380 : p2020-30 (2019) in pts with mild asthma it is reasonable to use low dose symbicort eg 80 2bid "prn" flare in this setting but I emphasized this was only shown with symbicort and takes advantage of the rapid  onset of action but is not the same as "rescue therapy" but can be stopped once the acute symptoms have resolved and the need for rescue has been minimized (< 2 x weekly)     >>>  rec refills per PCP,  No refills thru this office without being willing to complete the interview and exam with option of referral to one of my partners or [facility].    Total time for Hx,  chart review, counseling, teaching device and generating customized AVS unique to this office visit / charting = 35  min        Sandrea Hughs, MD 02/11/2020

## 2020-02-11 NOTE — Patient Instructions (Addendum)
It's ok to use flovent 220 up to 2 puffs every 12 hours but it takes a week to work fully  Call immediately for help if your breathing starts to get worse despite using the flovent as it has no immediate benefit   If you are satisfied with your treatment plan,  let your doctor know and she can either refill your medications or you can return here when your prescription runs out   Pulmonary follow up is as needed

## 2020-02-11 NOTE — Assessment & Plan Note (Addendum)
Seasonal rhinitis as child thru HS > improved to point where just rx zyrtec prn -cough triggered by non-specific exp/  placed on flovent 220 by [facility] ? 2016/7 seemed to help  - PFTs 11/13/17  Nl x for mid flows in 60% percent range with mild curvature better p saba and c/w asthma but not dx    - The proper method of use, as well as anticipated side effects, of a metered-dose inhaler were discussed and demonstrated to the patient.    Advised that if flovent 220 truly does help, it would suggest asthma, allergy or otherwise doesn' t matter but past response to flovent did not guarantee future response and should consider having a rescue inhaler as effective use of flovent in this setting requires either daily use or clairvoyance as take days to a week to start working.  >>> rec refills per PCP,  No refills thru this office without being willing to complete the interview and exam with option of referral to one of my partner's or [facility] group.          Each maintenance medication was reviewed in detail including emphasizing most importantly the difference between maintenance and prns and under what circumstances the prns are to be triggered using an action plan format where appropriate.  Total time for H and P, chart review locating and interpreting prior pfts, counseling, teaching device and generating customized AVS unique to this office visit / charting 45 min

## 2020-02-12 NOTE — Addendum Note (Signed)
Addended by: Sandrea Hughs B on: 02/12/2020 07:03 AM   Modules accepted: Level of Service

## 2022-07-04 ENCOUNTER — Encounter (INDEPENDENT_AMBULATORY_CARE_PROVIDER_SITE_OTHER): Payer: Self-pay | Admitting: *Deleted

## 2022-07-09 ENCOUNTER — Encounter: Payer: Self-pay | Admitting: Orthopaedic Surgery

## 2022-07-24 ENCOUNTER — Ambulatory Visit (INDEPENDENT_AMBULATORY_CARE_PROVIDER_SITE_OTHER): Payer: Medicare HMO | Admitting: Orthopaedic Surgery

## 2022-07-24 ENCOUNTER — Ambulatory Visit (INDEPENDENT_AMBULATORY_CARE_PROVIDER_SITE_OTHER): Payer: Medicare HMO

## 2022-07-24 ENCOUNTER — Encounter: Payer: Self-pay | Admitting: Orthopaedic Surgery

## 2022-07-24 VITALS — BP 154/85 | HR 74 | Ht 64.0 in | Wt 166.0 lb

## 2022-07-24 DIAGNOSIS — M25561 Pain in right knee: Secondary | ICD-10-CM

## 2022-07-24 DIAGNOSIS — G8929 Other chronic pain: Secondary | ICD-10-CM

## 2022-07-24 MED ORDER — METHYLPREDNISOLONE ACETATE 40 MG/ML IJ SUSP
40.0000 mg | Freq: Once | INTRAMUSCULAR | Status: AC
  Administered 2022-07-24: 40 mg via INTRA_ARTICULAR

## 2022-07-24 NOTE — Addendum Note (Signed)
Addended by: Obie Dredge A on: 07/24/2022 09:36 AM   Modules accepted: Orders

## 2022-07-24 NOTE — Progress Notes (Signed)
Subjective:    Patient ID: Bridget Yang, female    DOB: 1955-10-26, 66 y.o.   MRN: [id number]  HPI She has had right knee pain for over two years.  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 She has medial right knee pain, with swelling, popping and giving way at times. She has no recent trauma.  She has been seen at [facility]. She has been on Mobic which makes her sleepy and Voltaren Gel which helps a little.  She has tried heat, ice, Tylenol, Advil with minimal help.  She is tired of hurting.   Review of Systems  Constitutional:  Positive for activity change.  Musculoskeletal:  Positive for arthralgias, gait problem, joint swelling and myalgias.  All other systems reviewed and are negative. For Review of Systems, all other systems reviewed and are negative.  The following is a summary of the past history medically, past history surgically, known current medicines, social history and family history.  This information is gathered electronically by the computer from prior information and documentation.  I review this each visit and have found including this information at this point in the chart is beneficial and informative.   Past Medical History:  Diagnosis Date   Chest pain    ED evaluation in 11/2011; refused cardiac catheterization   Degenerative joint disease    Depression    Edema    [facility]-05/2011   Hyperlipidemia    Not requiring pharmacologic therapy   Hypertension    Migraine    Vertigo     Past Surgical History:  Procedure Laterality Date   HERNIA REPAIR      Current Outpatient Medications on File Prior to Visit  Medication Sig Dispense Refill   amLODipine (NORVASC) 5 MG tablet Take 5 mg by mouth daily.     atorvastatin (LIPITOR) 20 MG tablet Take 20 mg by mouth daily.     citalopram (CELEXA) 40 MG tablet Take 40 mg by mouth daily.     diclofenac Sodium (VOLTAREN) 1 % GEL Apply topically.     FLOVENT HFA 220  MCG/ACT inhaler      meclizine (ANTIVERT) 25 MG tablet Take 25 mg by mouth 3 (three) times daily as needed. For dizziness     meloxicam (MOBIC) 15 MG tablet Take 15 mg by mouth daily.     topiramate (TOPAMAX) 25 MG tablet Take 25 mg by mouth daily.     Vitamin D, Ergocalciferol, (DRISDOL) 50000 UNITS CAPS Take 50,000 Units by mouth every 7 (seven) days.     No current facility-administered medications on file prior to visit.    Social History   Socioeconomic History   Marital status: Single    Spouse name: Not on file   Number of children: Not on file   Years of education: Not on file   Highest education level: Not on file  Occupational History   Not on file  Tobacco Use   Smoking status: Never   Smokeless tobacco: Never  Substance and Sexual Activity   Alcohol use: Yes   Drug use: No   Sexual activity: Not on file  Other Topics Concern   Not on file  Social History Narrative   Not on file   Social Determinants of Health   Financial Resource Strain: Not on file  Food Insecurity: Not on file  Transportation Needs: Not on file  Physical Activity: Not on file  Stress: Not on file  Social Connections:  Not on file  Intimate Partner Violence: Not on file    Family History  Problem Relation Age of Onset   Hypertension Mother    Hyperlipidemia Mother    Heart failure Father    Heart disease Sister     BP (!) 154/85   Pulse 74   Ht 5\' 4"  (1.626 m)   Wt 166 lb (75.3 kg)   BMI 28.49 kg/m   Body mass index is 28.49 kg/m.      Objective:   Physical Exam Vitals and nursing note reviewed. Exam conducted with a chaperone present.  Constitutional:      Appearance: She is well-developed.  HENT:     Head: Normocephalic and atraumatic.  Eyes:     Conjunctiva/sclera: Conjunctivae normal.     Pupils: Pupils are equal, round, and reactive to light.  Cardiovascular:     Rate and Rhythm: Normal rate and regular rhythm.  Pulmonary:     Effort: Pulmonary effort is  normal.  Abdominal:     Palpations: Abdomen is soft.  Musculoskeletal:     Cervical back: Normal range of motion and neck supple.       Legs:  Skin:    General: Skin is warm and dry.  Neurological:     Mental Status: She is alert and oriented to person, place, and time.     Cranial Nerves: No cranial nerve deficit.     Motor: No abnormal muscle tone.     Coordination: Coordination normal.     Deep Tendon Reflexes: Reflexes are normal and symmetric. Reflexes normal.  Psychiatric:        Behavior: Behavior normal.        Thought Content: Thought content normal.        Judgment: Judgment normal.   X-rays were done of the right knee, reported separately.        Assessment & Plan:   Encounter Diagnosis  Name Primary?   Chronic pain of right knee Yes   I have shown her the X-rays.  PROCEDURE NOTE:  The patient requests injections of the right knee , verbal consent was obtained.  The right knee was prepped appropriately after time out was performed.   Sterile technique was observed and injection of 1 cc of DepoMedrol 40mg  with several cc's of plain xylocaine. Anesthesia was provided by ethyl chloride and a 20-gauge needle was used to inject the knee area. The injection was tolerated well.  A band aid dressing was applied.  The patient was advised to apply ice later today and tomorrow to the injection sight as needed.  Begin Aleve one twice a day after eating.  Return in two weeks.  She may need MRI.  Call if any problem.  Precautions discussed.  Electronically Signed Sanjuana Kava, MD 10/18/20239:29 AM

## 2022-08-06 ENCOUNTER — Ambulatory Visit (INDEPENDENT_AMBULATORY_CARE_PROVIDER_SITE_OTHER): Payer: Medicare HMO | Admitting: Orthopaedic Surgery

## 2022-08-06 ENCOUNTER — Encounter: Payer: Self-pay | Admitting: Orthopaedic Surgery

## 2022-08-06 DIAGNOSIS — M25561 Pain in right knee: Secondary | ICD-10-CM | POA: Diagnosis not present

## 2022-08-06 DIAGNOSIS — G8929 Other chronic pain: Secondary | ICD-10-CM | POA: Diagnosis not present

## 2022-08-06 NOTE — Patient Instructions (Signed)
While we are working on your approval please go ahead and call to schedule your appointment with [facility] today.We need at least 1 week to work on getting prior approval for your imaging. For example, if your appointment is scheduled for the first day of the month, we need you to make your imaging appointment for the 9th day of the month or later.  Central Scheduling [phone]  AFTER you have made your imaging appointment, please call our office back at [phone] to schedule an appointment to review your results. Your follow up appointment will need to be 3-4 days after your imaging is performed to allow time for radiology to read your imaging and for us to get the report. If you are having your imaging performed in a facility not associated with [facility], you will need to ask for a CD with your images on them.   

## 2022-08-06 NOTE — Progress Notes (Signed)
My knee is worse.  She had injection of the right knee last time and it only lasted with relief for a few days.  She could not tolerate the Aleve, it upset her stomach.  She has had more pain.  Today is cold and rainy and she says it is far worse today.  She has swelling, popping and feeling it will give way.  She has no new trauma.  Right knee has ROM 0 to 105, crepitus, just a slight effusion today, medial pain and positive medial McMurray.  Limp is present to the right.  NV intact.  She has no distal edema.  Encounter Diagnosis  Name Primary?   Chronic pain of right knee Yes   I will get MRI of the right knee as she has not improved.  I am concerned about meniscus tear.  Return in two weeks.  Call if any problem.  Precautions discussed.  Electronically Signed Sanjuana Kava, MD 10/31/20238:09 AM

## 2022-08-14 ENCOUNTER — Telehealth: Payer: Self-pay

## 2022-08-14 NOTE — Telephone Encounter (Signed)
PATIENT IS ON THE SCHEDULE FOR NEXT TUESDAY 08/20/22 FOR MRI KNEE REVIEW HOWEVER HER MRI ISN'T SCHEDULED UNTIL 08/28/22.  PLEASE CALL PATIENT AND MOVE APPT OUT FOR 09/03/22  THANK YOU

## 2022-08-15 NOTE — Telephone Encounter (Signed)
NOTED

## 2022-08-20 ENCOUNTER — Ambulatory Visit: Payer: Medicare HMO | Admitting: Orthopaedic Surgery

## 2022-08-28 ENCOUNTER — Ambulatory Visit (HOSPITAL_COMMUNITY)
Admission: RE | Admit: 2022-08-28 | Discharge: 2022-08-28 | Disposition: A | Discharge status: Home / Self Care | Payer: Medicare HMO | Source: Ambulatory Visit | Attending: Orthopaedic Surgery | Admitting: Orthopaedic Surgery

## 2022-08-28 DIAGNOSIS — M25561 Pain in right knee: Secondary | ICD-10-CM | POA: Diagnosis present

## 2022-08-28 DIAGNOSIS — G8929 Other chronic pain: Secondary | ICD-10-CM | POA: Insufficient documentation

## 2022-09-03 ENCOUNTER — Ambulatory Visit (INDEPENDENT_AMBULATORY_CARE_PROVIDER_SITE_OTHER): Payer: Medicare HMO | Admitting: Orthopaedic Surgery

## 2022-09-03 ENCOUNTER — Encounter: Payer: Self-pay | Admitting: Orthopaedic Surgery

## 2022-09-03 DIAGNOSIS — M25561 Pain in right knee: Secondary | ICD-10-CM | POA: Diagnosis not present

## 2022-09-03 DIAGNOSIS — G8929 Other chronic pain: Secondary | ICD-10-CM

## 2022-09-03 MED ORDER — TRAMADOL HCL 50 MG PO TABS: 50.0000 mg | ORAL_TABLET | Freq: Four times a day (QID) | ORAL | 0 refills | Status: AC | PRN

## 2022-09-03 MED ORDER — DICLOFENAC SODIUM 75 MG PO TBEC: 75.0000 mg | DELAYED_RELEASE_TABLET | Freq: Two times a day (BID) | ORAL | 2 refills | Status: DC

## 2022-09-03 NOTE — Progress Notes (Signed)
My knee is sore  She had the MRI of the right knee showing: IMPRESSION: 1. Significant patellofemoral joint degenerative changes with areas of full-thickness cartilage loss and subchondral cystic change (grade 4 chondromalacia). 2. Intact ligamentous structures and no acute bony findings. 3. No meniscal tears. 4. Small joint effusion.  I have explained the findings to her.    I have independently reviewed the MRI.    I will begin diclofenac and have pain medicine if she needs it.  Right knee is sore, crepitus, effusion, ROM 0 to 105, pain medially, limp right, NV intact.  Encounter Diagnosis  Name Primary?   Chronic pain of right knee Yes    I have reviewed the [place] Controlled Substance Reporting System web site prior to prescribing narcotic medicine for this patient.  Toradol given as well as diclofenac.  Stop the mobic.  Return in six weeks.  Call if any problem.  Precautions discussed.  Electronically Signed Darreld Mclean, MD 11/28/20232:08 PM

## 2022-09-03 NOTE — Patient Instructions (Signed)
You have been prescribed a narcotic pain medication. This medication will make you drowsy so do not drive or operate heavy machinery while you are taking this.   Take the pain medication only as needed  The Naproxen you will take twice daily once in the morning and once in the evening. You MUST take this medication with food to limit stomach upset. It will take about 10-14 days to get into your system.

## 2022-09-16 ENCOUNTER — Ambulatory Visit (INDEPENDENT_AMBULATORY_CARE_PROVIDER_SITE_OTHER): Payer: Medicaid Other | Admitting: Gastroenterology

## 2022-10-08 ENCOUNTER — Encounter: Payer: Self-pay | Admitting: Orthopaedic Surgery

## 2022-10-08 ENCOUNTER — Ambulatory Visit (INDEPENDENT_AMBULATORY_CARE_PROVIDER_SITE_OTHER): Payer: Medicare HMO | Admitting: Orthopaedic Surgery

## 2022-10-08 VITALS — BP 144/82 | HR 77 | Ht 64.0 in | Wt 166.0 lb

## 2022-10-08 DIAGNOSIS — M25561 Pain in right knee: Secondary | ICD-10-CM | POA: Diagnosis not present

## 2022-10-08 DIAGNOSIS — G8929 Other chronic pain: Secondary | ICD-10-CM | POA: Diagnosis not present

## 2022-10-08 NOTE — Patient Instructions (Signed)
As the weather changes and gets cooler, you may notice you are affected more. You may have more pain in your joints. This is normal. Dress warmly and make sure that area is covered well.   Aspercreme, Biofreeze, Blue Emu or Voltaren Gel over the counter 2-3 times daily. Rub into area well each use for best results.  

## 2022-10-08 NOTE — Progress Notes (Signed)
I am better.  Her right knee pain is improved.  She has less pain, no giving way, less swelling.  The diclofenac helped a lot.  She has no new trauma.  Right knee has slight effusion, crepitus, ROM 0 to 110, no limp, NV intact.  Encounter Diagnosis  Name Primary?   Chronic pain of right knee Yes   Continue the diclofenac.  Return in six weeks.  Call if any problem.  Precautions discussed.  Electronically Signed Sanjuana Kava, MD 1/2/20249:24 AM

## 2022-11-11 ENCOUNTER — Telehealth: Payer: Self-pay | Admitting: Orthopaedic Surgery

## 2022-11-11 NOTE — Telephone Encounter (Signed)
Pt lvm to cx her 11/19/22 appt.  I lvm for her to call to reschedule.

## 2022-11-19 ENCOUNTER — Ambulatory Visit (INDEPENDENT_AMBULATORY_CARE_PROVIDER_SITE_OTHER): Payer: Medicare HMO | Admitting: Orthopaedic Surgery

## 2022-11-19 ENCOUNTER — Ambulatory Visit: Payer: Medicare HMO | Admitting: Orthopaedic Surgery

## 2022-11-19 ENCOUNTER — Encounter: Payer: Self-pay | Admitting: Orthopaedic Surgery

## 2022-11-19 VITALS — BP 128/85 | HR 85 | Ht 64.0 in | Wt 163.0 lb

## 2022-11-19 DIAGNOSIS — G8929 Other chronic pain: Secondary | ICD-10-CM | POA: Diagnosis not present

## 2022-11-19 DIAGNOSIS — M25561 Pain in right knee: Secondary | ICD-10-CM | POA: Diagnosis not present

## 2022-11-19 NOTE — Progress Notes (Signed)
I am better.  She has good and bad days with the right knee but more good days.  She has much less swelling and pain. She has no new trauma. She is taking the diclofenac.  Right knee has minimal effusion today, some crepitus, ROM 0 to 115, stable, good gait, NV intact.  Encounter Diagnosis  Name Primary?   Chronic pain of right knee Yes   I will see prn. Continue the diclofenac.  Call if any problem.  Precautions discussed.  Electronically Signed Sanjuana Kava, MD 2/13/20242:15 PM

## 2023-01-21 ENCOUNTER — Telehealth: Payer: Self-pay | Admitting: Orthopaedic Surgery

## 2023-01-22 NOTE — Telephone Encounter (Signed)
done

## 2023-05-05 ENCOUNTER — Ambulatory Visit (INDEPENDENT_AMBULATORY_CARE_PROVIDER_SITE_OTHER): Payer: Medicare HMO | Admitting: Family Medicine

## 2023-05-05 ENCOUNTER — Encounter: Payer: Self-pay | Admitting: Family Medicine

## 2023-05-05 ENCOUNTER — Other Ambulatory Visit (HOSPITAL_COMMUNITY): Admission: RE | Admit: 2023-05-05 | Payer: Medicare HMO | Source: Ambulatory Visit | Admitting: Obstetrics & Gynecology

## 2023-05-05 VITALS — BP 153/98 | HR 90 | Ht 64.0 in | Wt 162.0 lb

## 2023-05-05 DIAGNOSIS — I10 Essential (primary) hypertension: Secondary | ICD-10-CM

## 2023-05-05 DIAGNOSIS — Z01419 Encounter for gynecological examination (general) (routine) without abnormal findings: Secondary | ICD-10-CM

## 2023-05-05 DIAGNOSIS — Z1151 Encounter for screening for human papillomavirus (HPV): Secondary | ICD-10-CM | POA: Insufficient documentation

## 2023-05-05 NOTE — Progress Notes (Signed)
GYNECOLOGY ANNUAL PREVENTATIVE CARE ENCOUNTER NOTE  History:     Bridget Yang is a 67 y.o. G88P2002 female here for a routine annual gynecologic exam.  Current complaints: none.   Denies abnormal vaginal bleeding, discharge, pelvic pain, or other gynecologic concerns.    Gynecologic History No LMP recorded. Patient is postmenopausal. Last Pap: no priors recorded. The patient states she has never had an abnormal pap. Pap to be collected today. Last Mammogram: none documented in epic. Scheduled  Last Colonoscopy: none documented in epic. Per PCP due 2031 Obstetric History OB History  Gravida Para Term Preterm AB Living  2 2 2     2   SAB IAB Ectopic Multiple Live Births          2    # Outcome Date GA Lbr Len/2nd Weight Sex Type Anes PTL Lv  2 Term 03/04/78    M Vag-Spont   LIV  1 Term 06/07/76    Genella Mech   LIV    Past Medical History:  Diagnosis Date   Asthma    Chest pain    ED evaluation in 11/2011; refused cardiac catheterization   Degenerative joint disease    Depression    Edema    [facility]-05/2011   Hyperlipidemia    Not requiring pharmacologic therapy   Hypertension    Migraine    Vertigo     Past Surgical History:  Procedure Laterality Date   HERNIA REPAIR      Current Outpatient Medications on File Prior to Visit  Medication Sig Dispense Refill   amLODipine (NORVASC) 5 MG tablet Take 5 mg by mouth daily.     atorvastatin (LIPITOR) 20 MG tablet Take 20 mg by mouth daily.     citalopram (CELEXA) 40 MG tablet Take 40 mg by mouth daily.     diclofenac Sodium (VOLTAREN) 1 % GEL Apply topically.     FLOVENT HFA 220 MCG/ACT inhaler      meclizine (ANTIVERT) 25 MG tablet Take 25 mg by mouth 3 (three) times daily as needed. For dizziness     Multiple Vitamin (MULTIVITAMIN) tablet Take 1 tablet by mouth daily.     topiramate (TOPAMAX) 25 MG tablet Take 25 mg by mouth daily.     VITAMIN D PO Take by mouth daily.     No current  facility-administered medications on file prior to visit.    Allergies  Allergen Reactions   Latex Itching   Sulfa Antibiotics Itching and Other (See Comments)    Blood shot eyes Other reaction(s): Not available Other reaction(s): Unknown Other reaction(s): RASH     Social History:  reports that she has never smoked. She has never used smokeless tobacco. She reports that she does not currently use alcohol. She reports that she does not use drugs.  Family History  Problem Relation Age of Onset   Cancer Father        throat, stomach   Heart failure Father    Dementia Father    Breast cancer Mother    Hypertension Mother    Hyperlipidemia Mother    Mental retardation Brother    Hyperlipidemia Sister    Hypertension Sister    Hyperlipidemia Sister    Hypertension Sister    Post-traumatic stress disorder Sister    Heart disease Sister    Other Sister        Covid   HIV Son    Bipolar disorder Son    Anxiety disorder Son  Depression Son     The following portions of the patient's history were reviewed and updated as appropriate: allergies, current medications, past family history, past medical history, past social history, past surgical history and problem list.   Physical Exam:  BP (!) 144/85 (BP Location: Left Arm, Patient Position: Sitting, Cuff Size: Normal)   Pulse 79   Ht 5\' 4"  (1.626 m)   Wt 162 lb (73.5 kg)   BMI 27.81 kg/m  CONSTITUTIONAL: Well-developed, well-nourished female in no acute distress.  HENT:  Normocephalic, atraumatic, External right and left ear normal.  EYES: Conjunctivae and EOM are normal. No scleral icterus.  NECK: Normal range of motion, supple, no masses.  Normal thyroid.  SKIN: Skin is warm and dry. No rash noted. Not diaphoretic. No erythema. No pallor. MUSCULOSKELETAL: Normal range of motion. No tenderness.  No cyanosis, clubbing, or edema. NEUROLOGIC: Alert and oriented to person, place, and time. Normal reflexes, muscle tone  coordination.  PSYCHIATRIC: Normal mood and affect. Normal behavior. Normal judgment and thought content. CARDIOVASCULAR: Normal heart rate noted, regular rhythm RESPIRATORY: Clear to auscultation bilaterally. Effort and breath sounds normal, no problems with respiration noted. ABDOMEN: Soft, no distention noted.  No tenderness, rebound or guarding.  PELVIC: Normal appearing external genitalia and urethral meatus; normal appearing vaginal mucosa and cervix.  No abnormal vaginal discharge noted.  Pap smear obtained. Performed in the presence of a chaperone.   Assessment and Plan:    1. Encounter for gynecological examination without abnormal finding - Cytology - PAP ([place])  2. Hypertension, unspecified type Follows with PCP  Will follow up results of pap smear and manage accordingly. Mammogram scheduled 8/29 Colon cancer screening is up to date due 2031 Routine preventative health maintenance measures emphasized. Please refer to After Visit Summary for other counseling recommendations.      Lavonda Jumbo, DO OB Fellow, Faculty [facility], Center for [facility] 05/05/2023, 9:14 AM
